# Patient Record
Sex: Female | Born: 1985 | Race: Black or African American | Hispanic: No | Marital: Single | Smoking: Former smoker
Health system: Southern US, Community
[De-identification: ages and names within clinical notes are randomized; demographics above are authoritative.]

## PROBLEM LIST (undated history)

## (undated) HISTORY — PX: TUBAL LIGATION: SHX77

---

## 2005-05-17 ENCOUNTER — Ambulatory Visit: Payer: Self-pay | Admitting: Nurse Practitioner

## 2005-05-21 ENCOUNTER — Ambulatory Visit: Payer: Self-pay | Admitting: *Deleted

## 2005-05-22 ENCOUNTER — Ambulatory Visit: Payer: Self-pay | Admitting: Nurse Practitioner

## 2005-06-19 ENCOUNTER — Ambulatory Visit: Payer: Self-pay | Admitting: Nurse Practitioner

## 2005-06-19 LAB — CONVERTED CEMR LAB: TSH: 1.178 microintl units/mL

## 2005-08-09 ENCOUNTER — Ambulatory Visit: Payer: Self-pay | Admitting: Nurse Practitioner

## 2005-08-19 ENCOUNTER — Ambulatory Visit: Payer: Self-pay | Admitting: Nurse Practitioner

## 2005-12-02 ENCOUNTER — Emergency Department (HOSPITAL_COMMUNITY): Admission: EM | Admit: 2005-12-02 | Discharge: 2005-12-02 | Payer: Self-pay | Admitting: Emergency Medicine

## 2005-12-10 ENCOUNTER — Ambulatory Visit: Payer: Self-pay | Admitting: Family Medicine

## 2005-12-16 ENCOUNTER — Ambulatory Visit (HOSPITAL_COMMUNITY): Admission: RE | Admit: 2005-12-16 | Discharge: 2005-12-16 | Payer: Self-pay | Admitting: Family Medicine

## 2005-12-17 ENCOUNTER — Ambulatory Visit: Payer: Self-pay | Admitting: Nurse Practitioner

## 2006-03-14 ENCOUNTER — Inpatient Hospital Stay (HOSPITAL_COMMUNITY): Admission: AD | Admit: 2006-03-14 | Discharge: 2006-03-14 | Payer: Self-pay | Admitting: Obstetrics & Gynecology

## 2006-03-20 ENCOUNTER — Inpatient Hospital Stay (HOSPITAL_COMMUNITY): Admission: AD | Admit: 2006-03-20 | Discharge: 2006-03-20 | Payer: Self-pay | Admitting: Gynecology

## 2006-05-07 ENCOUNTER — Ambulatory Visit (HOSPITAL_COMMUNITY): Admission: RE | Admit: 2006-05-07 | Discharge: 2006-05-07 | Payer: Self-pay | Admitting: Obstetrics & Gynecology

## 2006-05-22 ENCOUNTER — Encounter (INDEPENDENT_AMBULATORY_CARE_PROVIDER_SITE_OTHER): Payer: Self-pay | Admitting: Nurse Practitioner

## 2006-05-22 LAB — CONVERTED CEMR LAB: Pap Smear: NORMAL

## 2006-05-23 ENCOUNTER — Inpatient Hospital Stay (HOSPITAL_COMMUNITY): Admission: AD | Admit: 2006-05-23 | Discharge: 2006-05-23 | Payer: Self-pay | Admitting: Obstetrics & Gynecology

## 2006-06-25 ENCOUNTER — Ambulatory Visit: Payer: Self-pay | Admitting: Gynecology

## 2006-06-25 ENCOUNTER — Inpatient Hospital Stay (HOSPITAL_COMMUNITY): Admission: AD | Admit: 2006-06-25 | Discharge: 2006-06-25 | Payer: Self-pay | Admitting: Gynecology

## 2006-07-17 ENCOUNTER — Ambulatory Visit: Payer: Self-pay | Admitting: Obstetrics & Gynecology

## 2006-07-17 ENCOUNTER — Inpatient Hospital Stay (HOSPITAL_COMMUNITY): Admission: AD | Admit: 2006-07-17 | Discharge: 2006-07-17 | Payer: Self-pay | Admitting: Obstetrics and Gynecology

## 2006-08-14 ENCOUNTER — Ambulatory Visit: Payer: Self-pay | Admitting: Obstetrics and Gynecology

## 2006-08-14 ENCOUNTER — Inpatient Hospital Stay (HOSPITAL_COMMUNITY): Admission: AD | Admit: 2006-08-14 | Discharge: 2006-08-14 | Payer: Self-pay | Admitting: Obstetrics and Gynecology

## 2006-08-27 ENCOUNTER — Inpatient Hospital Stay (HOSPITAL_COMMUNITY): Admission: AD | Admit: 2006-08-27 | Discharge: 2006-08-27 | Payer: Self-pay | Admitting: Obstetrics & Gynecology

## 2006-09-04 ENCOUNTER — Inpatient Hospital Stay (HOSPITAL_COMMUNITY): Admission: AD | Admit: 2006-09-04 | Discharge: 2006-09-05 | Payer: Self-pay | Admitting: Obstetrics & Gynecology

## 2006-09-04 ENCOUNTER — Ambulatory Visit: Payer: Self-pay | Admitting: *Deleted

## 2006-09-07 ENCOUNTER — Ambulatory Visit: Payer: Self-pay | Admitting: Obstetrics and Gynecology

## 2006-09-07 ENCOUNTER — Observation Stay (HOSPITAL_COMMUNITY): Admission: AD | Admit: 2006-09-07 | Discharge: 2006-09-08 | Payer: Self-pay | Admitting: Gynecology

## 2006-09-09 ENCOUNTER — Ambulatory Visit: Payer: Self-pay | Admitting: *Deleted

## 2006-09-09 ENCOUNTER — Inpatient Hospital Stay (HOSPITAL_COMMUNITY): Admission: AD | Admit: 2006-09-09 | Discharge: 2006-09-09 | Payer: Self-pay | Admitting: Family Medicine

## 2006-09-10 ENCOUNTER — Inpatient Hospital Stay (HOSPITAL_COMMUNITY): Admission: AD | Admit: 2006-09-10 | Discharge: 2006-09-10 | Payer: Self-pay | Admitting: Gynecology

## 2006-09-10 ENCOUNTER — Ambulatory Visit: Payer: Self-pay | Admitting: *Deleted

## 2006-09-11 ENCOUNTER — Ambulatory Visit: Payer: Self-pay | Admitting: Family Medicine

## 2006-09-12 ENCOUNTER — Ambulatory Visit: Payer: Self-pay | Admitting: Family Medicine

## 2006-09-13 ENCOUNTER — Ambulatory Visit: Payer: Self-pay | Admitting: Family Medicine

## 2006-09-13 ENCOUNTER — Inpatient Hospital Stay (HOSPITAL_COMMUNITY): Admission: AD | Admit: 2006-09-13 | Discharge: 2006-09-14 | Payer: Self-pay | Admitting: Family Medicine

## 2006-09-15 ENCOUNTER — Ambulatory Visit: Payer: Self-pay | Admitting: Obstetrics & Gynecology

## 2006-09-18 ENCOUNTER — Ambulatory Visit: Payer: Self-pay | Admitting: *Deleted

## 2006-09-18 ENCOUNTER — Inpatient Hospital Stay (HOSPITAL_COMMUNITY): Admission: AD | Admit: 2006-09-18 | Discharge: 2006-09-21 | Payer: Self-pay | Admitting: Gynecology

## 2006-09-18 ENCOUNTER — Ambulatory Visit: Payer: Self-pay | Admitting: Obstetrics & Gynecology

## 2006-09-22 ENCOUNTER — Encounter: Admission: RE | Admit: 2006-09-22 | Discharge: 2006-10-22 | Payer: Self-pay | Admitting: Gynecology

## 2006-11-10 ENCOUNTER — Emergency Department (HOSPITAL_COMMUNITY): Admission: EM | Admit: 2006-11-10 | Discharge: 2006-11-10 | Payer: Self-pay | Admitting: Emergency Medicine

## 2006-12-05 ENCOUNTER — Emergency Department (HOSPITAL_COMMUNITY): Admission: EM | Admit: 2006-12-05 | Discharge: 2006-12-05 | Payer: Self-pay | Admitting: Emergency Medicine

## 2007-02-09 ENCOUNTER — Encounter: Payer: Self-pay | Admitting: Nurse Practitioner

## 2007-02-09 DIAGNOSIS — J45909 Unspecified asthma, uncomplicated: Secondary | ICD-10-CM | POA: Insufficient documentation

## 2007-02-10 DIAGNOSIS — J309 Allergic rhinitis, unspecified: Secondary | ICD-10-CM | POA: Insufficient documentation

## 2007-07-14 ENCOUNTER — Ambulatory Visit: Payer: Self-pay | Admitting: Internal Medicine

## 2007-07-14 ENCOUNTER — Encounter: Payer: Self-pay | Admitting: Internal Medicine

## 2007-10-01 ENCOUNTER — Ambulatory Visit: Payer: Self-pay | Admitting: Family Medicine

## 2007-10-02 ENCOUNTER — Emergency Department (HOSPITAL_COMMUNITY): Admission: EM | Admit: 2007-10-02 | Discharge: 2007-10-02 | Payer: Self-pay | Admitting: Emergency Medicine

## 2007-11-26 ENCOUNTER — Ambulatory Visit: Payer: Self-pay | Admitting: Internal Medicine

## 2009-07-02 ENCOUNTER — Ambulatory Visit: Payer: Self-pay | Admitting: Advanced Practice Midwife

## 2009-07-02 ENCOUNTER — Inpatient Hospital Stay (HOSPITAL_COMMUNITY): Admission: AD | Admit: 2009-07-02 | Discharge: 2009-07-03 | Payer: Self-pay | Admitting: Obstetrics & Gynecology

## 2009-08-17 ENCOUNTER — Ambulatory Visit: Payer: Self-pay | Admitting: Psychiatry

## 2009-09-11 ENCOUNTER — Ambulatory Visit: Payer: Self-pay | Admitting: Psychiatry

## 2009-09-25 ENCOUNTER — Ambulatory Visit: Payer: Self-pay | Admitting: Psychiatry

## 2010-01-27 ENCOUNTER — Ambulatory Visit: Payer: Self-pay | Admitting: Obstetrics & Gynecology

## 2010-01-27 ENCOUNTER — Inpatient Hospital Stay (HOSPITAL_COMMUNITY)
Admission: AD | Admit: 2010-01-27 | Discharge: 2010-01-27 | Payer: Self-pay | Source: Home / Self Care | Admitting: Obstetrics & Gynecology

## 2010-03-05 ENCOUNTER — Ambulatory Visit: Payer: Self-pay | Admitting: Family

## 2010-03-05 ENCOUNTER — Inpatient Hospital Stay (HOSPITAL_COMMUNITY): Admission: AD | Admit: 2010-03-05 | Discharge: 2010-03-05 | Payer: Self-pay | Admitting: Obstetrics & Gynecology

## 2010-03-10 ENCOUNTER — Ambulatory Visit: Payer: Self-pay | Admitting: Obstetrics and Gynecology

## 2010-03-10 ENCOUNTER — Inpatient Hospital Stay (HOSPITAL_COMMUNITY): Admission: AD | Admit: 2010-03-10 | Discharge: 2010-03-10 | Payer: Self-pay | Admitting: Obstetrics & Gynecology

## 2010-03-12 ENCOUNTER — Ambulatory Visit: Payer: Self-pay | Admitting: Family Medicine

## 2010-03-12 ENCOUNTER — Inpatient Hospital Stay (HOSPITAL_COMMUNITY): Admission: AD | Admit: 2010-03-12 | Discharge: 2010-03-12 | Payer: Self-pay | Admitting: Family Medicine

## 2010-03-19 ENCOUNTER — Inpatient Hospital Stay (HOSPITAL_COMMUNITY): Admission: RE | Admit: 2010-03-19 | Discharge: 2010-03-19 | Payer: Self-pay | Admitting: Obstetrics & Gynecology

## 2010-03-19 ENCOUNTER — Ambulatory Visit: Payer: Self-pay | Admitting: Obstetrics & Gynecology

## 2010-04-04 ENCOUNTER — Ambulatory Visit: Payer: Self-pay | Admitting: Obstetrics & Gynecology

## 2010-04-04 ENCOUNTER — Inpatient Hospital Stay (HOSPITAL_COMMUNITY): Admission: AD | Admit: 2010-04-04 | Discharge: 2010-04-04 | Payer: Self-pay | Admitting: Family Medicine

## 2010-08-29 LAB — URINALYSIS, ROUTINE W REFLEX MICROSCOPIC
Bilirubin Urine: NEGATIVE
Ketones, ur: NEGATIVE mg/dL
Nitrite: NEGATIVE
Protein, ur: NEGATIVE mg/dL
Urobilinogen, UA: 0.2 mg/dL (ref 0.0–1.0)

## 2010-08-29 LAB — WET PREP, GENITAL

## 2010-08-30 LAB — URINALYSIS, ROUTINE W REFLEX MICROSCOPIC
Bilirubin Urine: NEGATIVE
Glucose, UA: NEGATIVE mg/dL
Ketones, ur: NEGATIVE mg/dL
Ketones, ur: NEGATIVE mg/dL
Nitrite: NEGATIVE
Nitrite: NEGATIVE
Nitrite: NEGATIVE
Protein, ur: NEGATIVE mg/dL
Specific Gravity, Urine: 1.01 (ref 1.005–1.030)
Specific Gravity, Urine: <1.005 — ABNORMAL LOW (ref 1.005–1.030)
Urobilinogen, UA: 0.2 mg/dL (ref 0.0–1.0)
Urobilinogen, UA: 0.2 mg/dL (ref 0.0–1.0)
pH: 6 (ref 5.0–8.0)
pH: 6 (ref 5.0–8.0)

## 2010-08-30 LAB — CBC
HCT: 36.2 % (ref 36.0–46.0)
MCH: 28.1 pg (ref 26.0–34.0)
MCHC: 34.2 g/dL (ref 30.0–36.0)
MCV: 82.1 fL (ref 78.0–100.0)
Platelets: 253 10*3/uL (ref 150–400)
RDW: 12 % (ref 11.5–15.5)

## 2010-08-30 LAB — GC/CHLAMYDIA PROBE AMP, GENITAL: GC Probe Amp, Genital: NEGATIVE

## 2010-08-30 LAB — HCG, QUANTITATIVE, PREGNANCY: hCG, Beta Chain, Quant, S: 3447 m[IU]/mL — ABNORMAL HIGH (ref <5)

## 2010-08-30 LAB — WET PREP, GENITAL
Trich, Wet Prep: NONE SEEN
Yeast Wet Prep HPF POC: NONE SEEN

## 2010-08-30 LAB — WOUND CULTURE

## 2010-08-31 LAB — URINALYSIS, ROUTINE W REFLEX MICROSCOPIC
Glucose, UA: NEGATIVE mg/dL
Hgb urine dipstick: NEGATIVE
Specific Gravity, Urine: 1.02 (ref 1.005–1.030)
Urobilinogen, UA: 1 mg/dL (ref 0.0–1.0)

## 2010-08-31 LAB — POCT PREGNANCY, URINE: Preg Test, Ur: NEGATIVE

## 2010-08-31 LAB — GC/CHLAMYDIA PROBE AMP, GENITAL: GC Probe Amp, Genital: NEGATIVE

## 2010-09-02 LAB — URINALYSIS, ROUTINE W REFLEX MICROSCOPIC
Ketones, ur: 15 mg/dL — AB
Nitrite: NEGATIVE
Specific Gravity, Urine: 1.025 (ref 1.005–1.030)
Urobilinogen, UA: 0.2 mg/dL (ref 0.0–1.0)
pH: 6 (ref 5.0–8.0)

## 2010-09-02 LAB — URINE MICROSCOPIC-ADD ON

## 2010-09-02 LAB — GC/CHLAMYDIA PROBE AMP, GENITAL: Chlamydia, DNA Probe: POSITIVE — AB

## 2010-09-02 LAB — WET PREP, GENITAL
Trich, Wet Prep: NONE SEEN
Yeast Wet Prep HPF POC: NONE SEEN

## 2010-09-14 ENCOUNTER — Inpatient Hospital Stay (HOSPITAL_COMMUNITY)
Admission: AD | Admit: 2010-09-14 | Discharge: 2010-09-14 | Disposition: A | Discharge status: Home / Self Care | Payer: BC Managed Care – PPO | Source: Ambulatory Visit | Attending: Obstetrics and Gynecology | Admitting: Obstetrics and Gynecology

## 2010-09-14 DIAGNOSIS — O9989 Other specified diseases and conditions complicating pregnancy, childbirth and the puerperium: Secondary | ICD-10-CM

## 2010-09-14 DIAGNOSIS — M79609 Pain in unspecified limb: Secondary | ICD-10-CM

## 2010-09-14 DIAGNOSIS — O99891 Other specified diseases and conditions complicating pregnancy: Secondary | ICD-10-CM | POA: Insufficient documentation

## 2010-09-14 LAB — CBC
Hemoglobin: 12.9 g/dL (ref 12.0–15.0)
MCHC: 33.6 g/dL (ref 30.0–36.0)
Platelets: 188 10*3/uL (ref 150–400)
RDW: 13.7 % (ref 11.5–15.5)

## 2010-09-15 LAB — URINALYSIS, ROUTINE W REFLEX MICROSCOPIC
Ketones, ur: NEGATIVE mg/dL
Nitrite: NEGATIVE
Protein, ur: NEGATIVE mg/dL
Urobilinogen, UA: 0.2 mg/dL (ref 0.0–1.0)

## 2010-09-15 LAB — URINE MICROSCOPIC-ADD ON

## 2010-09-16 LAB — URINE CULTURE
Colony Count: 25000
Culture  Setup Time: 201203310529

## 2010-11-02 NOTE — Discharge Summary (Signed)
[name], [name]               ACCOUNT NO.:  [id number]   MEDICAL RECORD NO.:  [id number]          PATIENT TYPE:  OBV   LOCATION:  9158                          FACILITY:  [facility]   PHYSICIAN:  Ginger Carne, MD  DATE OF BIRTH:  [birth year]   DATE OF ADMISSION:  09/07/2006  DATE OF DISCHARGE:  09/08/2006                               DISCHARGE SUMMARY   ADMISSION DIAGNOSES:  1. Intrauterine pregnancy at 46 weeks' gestation.  2. Pregnancy induced hypertension.   DISCHARGE DIAGNOSES:  1. Intrauterine pregnancy at 43 weeks' gestation.  2. Pregnancy induced hypertension.   PERTINENT LABORATORY:  Included hemoglobin of 13.1, platelets 247,  creatinine 0.51, AST 19, ALT 12, LDH 152, uric acid 4.9.  A 24-hour  urine protein 72.   STUDIES:  Ultrasound done September 07, 2006, showed baby in cephalic  position.  AFI 11.4.  Estimated fetal weight 2,795 grams which is the  65th percentile.   REASON FOR ADMISSION:  This is a 25 year old gravida 1, at 67 weeks'  gestation who presented with headaches that have been worsening for the  last 2 days and elevated blood pressure.  Blood pressure was 151/97,  139/95, 146/98.  She was admitted for observation, rule out pre-  eclampsia.   HOSPITAL COURSE:  The patient admitted with elevated blood pressure,  started on a 24-hour urine.  Throughout her hospital stay her blood  pressure ranged from 120s to 150s over 80s to 103.  The patient's  headache was relieved with Percocet.  The fetal heart tracing remained  reactive and reassuring.  Her 24-hour urine protein result was 72 mg.  During the hospital course, the patient complained of some uterine  contractions, no leaking of fluid, no vaginal bleeding or discharge.  Her cervical exam was 1, thick and high, and felt to be stable from  previous exam.  She remained hemodynamically stable.   She was discharged home with instructions to follow up at [facility] on September 11, 2006 at 7:45 a.m. for  prenatal visit and NST.  She  is to begin twice weekly testing.  She was also given preterm labor and  pre-eclampsia precautions.   DIET:  Regular.   ACTIVITY:  No restrictions.   DISCHARGE MEDICATIONS:  Tylenol as needed for pain and prenatal vitamins  daily.     ______________________________  Julia Stack, MD      Ginger Carne, MD  Electronically Signed    LNJ/MEDQ  D:  09/08/2006  T:  09/08/2006  Job:  [id number]

## 2010-11-05 ENCOUNTER — Inpatient Hospital Stay (HOSPITAL_COMMUNITY)
Admission: RE | Admit: 2010-11-05 | Discharge: 2010-11-07 | DRG: 652 | Disposition: A | Discharge status: Home / Self Care | Payer: BC Managed Care – PPO | Source: Ambulatory Visit | Attending: Obstetrics and Gynecology | Admitting: Obstetrics and Gynecology

## 2010-11-05 DIAGNOSIS — Z22330 Carrier of Group B streptococcus: Secondary | ICD-10-CM

## 2010-11-05 DIAGNOSIS — Z302 Encounter for sterilization: Secondary | ICD-10-CM

## 2010-11-05 DIAGNOSIS — O1414 Severe pre-eclampsia complicating childbirth: Principal | ICD-10-CM | POA: Diagnosis present

## 2010-11-05 DIAGNOSIS — O99892 Other specified diseases and conditions complicating childbirth: Secondary | ICD-10-CM | POA: Diagnosis present

## 2010-11-05 LAB — CBC
MCHC: 33.8 g/dL (ref 30.0–36.0)
Platelets: 185 10*3/uL (ref 150–400)
RDW: 14 % (ref 11.5–15.5)
WBC: 8.5 10*3/uL (ref 4.0–10.5)

## 2010-11-05 LAB — SURGICAL PCR SCREEN
MRSA, PCR: NEGATIVE
Staphylococcus aureus: NEGATIVE

## 2010-11-06 ENCOUNTER — Other Ambulatory Visit: Payer: Self-pay | Admitting: Obstetrics and Gynecology

## 2010-11-06 LAB — CBC
HCT: 36.2 % (ref 36.0–46.0)
Hemoglobin: 11.9 g/dL — ABNORMAL LOW (ref 12.0–15.0)
MCH: 26.9 pg (ref 26.0–34.0)
MCHC: 32.9 g/dL (ref 30.0–36.0)
RDW: 13.9 % (ref 11.5–15.5)

## 2010-11-12 ENCOUNTER — Inpatient Hospital Stay (HOSPITAL_COMMUNITY)
Admission: AD | Admit: 2010-11-12 | Payer: BC Managed Care – PPO | Source: Ambulatory Visit | Admitting: Obstetrics and Gynecology

## 2010-11-14 NOTE — Op Note (Signed)
NAMEDESHONDA, Julia Lutz               ACCOUNT NO.:  [id number]  MEDICAL RECORD NO.:  [id number]           PATIENT TYPE:  I  LOCATION:  9124                          FACILITY:  [facility]  PHYSICIAN:  Pieter Partridge, MD   DATE OF BIRTH:  12-16-1985  DATE OF PROCEDURE:  11/07/2010 DATE OF DISCHARGE:  11/07/2010                              OPERATIVE REPORT   PREOPERATIVE DIAGNOSIS:  Undesired fertility.  POSTOPERATIVE DIAGNOSIS:  Undesired fertility.  PROCEDURE:  Postpartum tubal ligation via Pomeroy method.  SURGEON:  Pieter Partridge, MD  ASSISTANT:  Technician.  ANESTHESIA:  General.  FINDINGS:  Normal fallopian tubes bilaterally.  SPECIMENS:  Bilateral fallopian tube segments.  DISPOSITION:  Specimen to pathology.  ESTIMATED BLOOD LOSS:  Minimal.  IV FLUIDS:  900 mL.  URINE OUTPUT:  100 mL.  COMPLICATIONS:  None.  DISPOSITION:  To PACU in stable condition.  PROCEDURE IN DETAIL:  Julia Lutz was identified in the holding area. She was then taken to the OR suite and placed in the dorsal supine position and underwent general endotracheal anesthesia without complication.  She was then prepped and draped in a normal sterile fashion.  Foley to gravity was placed.  Marcaine 0.25% approximately 30 mL was injected in the infraumbilical fold.  A 3-mm incision was then made in the infraumbilical fold with the scalpel.  The edges were grasped with Allis clamp and the subcutaneous tissue was dissected with a scalpel.  A Kelly clamp was used to continue to dissect the subcutaneous tissue and the fascia was then grasped with the [employer].  After the fascia was grasped, it was entered sharply with the Mayo scissors, and the fascia was then tagged with a 0 Vicryl suture.  Peritoneum was then identified and tented up with a hemostat and entered sharply with the Metzenbaum scissors.  Intra-abdominal access was confirmed with visualization of omentum.  The patient was then placed in  Trendelenburg and moist sponge was then placed in the abdomen, which was tagged with hemostat.  The left fallopian tube was identified and grasped with the Babcock and then followed out to the fimbriated end.  A tubal ligation was then done via the usual Pomeroy method.  There were two sutures of 2-0 plain gut that were used to ligate.  The fallopian tubes were then transected with the Metzenbaum scissors and the lumen of the fallopian tube was cauterized with the Bovie and other tissues were cauterized as needed.  The tube was then returned to the abdomen.  The same was done on the opposite side.  That tube appeared to be a little longer and again there was another maybe 3 cm segment that was removed without complication.  Also tubes were returned to the abdomen.  All the laparotomy sponge was removed.  The fascia was then reapproximated or grasped with a Kocher clamp and then reapproximated with 0 Vicryl in a continuous locked fashion.  The subcutaneous space was then reapproximated with 4-0 Monocryl in a continuous fashion and then Dermabond was applied to the incision.  The patient tolerated the procedure well and taken to the recovery  room in stable condition.     Pieter Partridge, MD     EBV/MEDQ  D:  11/07/2010  T:  11/08/2010  Job:  [id number]  Electronically Signed by Geryl Rankins MD on 11/14/2010 05:54:17 PM

## 2011-03-12 LAB — BASIC METABOLIC PANEL
BUN: 10
CO2: 27
Calcium: 8.7
Chloride: 99
Creatinine, Ser: 0.67
GFR calc Af Amer: >60

## 2011-03-12 LAB — RAPID STREP SCREEN (MED CTR MEBANE ONLY): Streptococcus, Group A Screen (Direct): NEGATIVE

## 2011-03-12 LAB — CBC
MCHC: 34.8
RBC: 4.56
RDW: 12

## 2011-03-12 LAB — DIFFERENTIAL
Basophils Absolute: 0
Basophils Relative: 0
Lymphocytes Relative: 33
Monocytes Relative: 12
Neutro Abs: 1.5 — ABNORMAL LOW
Neutrophils Relative %: 54

## 2011-04-03 LAB — RAPID STREP SCREEN (MED CTR MEBANE ONLY): Streptococcus, Group A Screen (Direct): NEGATIVE

## 2011-06-25 ENCOUNTER — Other Ambulatory Visit (HOSPITAL_COMMUNITY)
Admission: RE | Admit: 2011-06-25 | Discharge: 2011-06-25 | Disposition: A | Discharge status: Home / Self Care | Payer: BC Managed Care – PPO | Source: Ambulatory Visit | Attending: Obstetrics and Gynecology | Admitting: Obstetrics and Gynecology

## 2011-06-25 ENCOUNTER — Other Ambulatory Visit: Payer: Self-pay | Admitting: Obstetrics and Gynecology

## 2011-06-25 DIAGNOSIS — Z01419 Encounter for gynecological examination (general) (routine) without abnormal findings: Secondary | ICD-10-CM | POA: Insufficient documentation

## 2011-06-25 DIAGNOSIS — Z124 Encounter for screening for malignant neoplasm of cervix: Secondary | ICD-10-CM | POA: Insufficient documentation

## 2011-06-25 DIAGNOSIS — Z1159 Encounter for screening for other viral diseases: Secondary | ICD-10-CM | POA: Insufficient documentation

## 2011-06-25 DIAGNOSIS — Z113 Encounter for screening for infections with a predominantly sexual mode of transmission: Secondary | ICD-10-CM | POA: Insufficient documentation

## 2011-07-29 ENCOUNTER — Other Ambulatory Visit: Payer: Self-pay | Admitting: Obstetrics & Gynecology

## 2011-08-21 ENCOUNTER — Other Ambulatory Visit: Payer: Self-pay | Admitting: Obstetrics & Gynecology

## 2012-07-23 ENCOUNTER — Emergency Department (HOSPITAL_BASED_OUTPATIENT_CLINIC_OR_DEPARTMENT_OTHER): Payer: BC Managed Care – PPO

## 2012-07-23 ENCOUNTER — Emergency Department (HOSPITAL_BASED_OUTPATIENT_CLINIC_OR_DEPARTMENT_OTHER)
Admission: EM | Admit: 2012-07-23 | Discharge: 2012-07-23 | Disposition: A | Discharge status: Home / Self Care | Payer: BC Managed Care – PPO | Attending: Emergency Medicine | Admitting: Emergency Medicine

## 2012-07-23 ENCOUNTER — Encounter (HOSPITAL_BASED_OUTPATIENT_CLINIC_OR_DEPARTMENT_OTHER): Payer: Self-pay | Admitting: *Deleted

## 2012-07-23 DIAGNOSIS — M542 Cervicalgia: Secondary | ICD-10-CM | POA: Insufficient documentation

## 2012-07-23 DIAGNOSIS — R110 Nausea: Secondary | ICD-10-CM | POA: Insufficient documentation

## 2012-07-23 DIAGNOSIS — Z79899 Other long term (current) drug therapy: Secondary | ICD-10-CM | POA: Insufficient documentation

## 2012-07-23 DIAGNOSIS — R51 Headache: Secondary | ICD-10-CM | POA: Insufficient documentation

## 2012-07-23 LAB — COMPREHENSIVE METABOLIC PANEL
ALT: 14 U/L (ref 0–35)
AST: 17 U/L (ref 0–37)
CO2: 27 mEq/L (ref 19–32)
Calcium: 10.1 mg/dL (ref 8.4–10.5)
Chloride: 102 mEq/L (ref 96–112)
GFR calc non Af Amer: >90 mL/min (ref >90)
Potassium: 3.7 mEq/L (ref 3.5–5.1)
Sodium: 140 mEq/L (ref 135–145)
Total Bilirubin: 0.5 mg/dL (ref 0.3–1.2)

## 2012-07-23 LAB — PROTEIN AND GLUCOSE, CSF
Glucose, CSF: 56 mg/dL (ref 43–76)
Total  Protein, CSF: 16 mg/dL (ref 15–45)

## 2012-07-23 LAB — CSF CELL COUNT WITH DIFFERENTIAL
Eosinophils, CSF: NONE SEEN % (ref 0–1)
Segmented Neutrophils-CSF: NONE SEEN % (ref 0–6)
Segmented Neutrophils-CSF: NONE SEEN % (ref 0–6)
WBC, CSF: 1 /mm3 (ref 0–5)

## 2012-07-23 LAB — CBC WITH DIFFERENTIAL/PLATELET
Basophils Absolute: 0 10*3/uL (ref 0.0–0.1)
Lymphocytes Relative: 51 % — ABNORMAL HIGH (ref 12–46)
Neutro Abs: 2.1 10*3/uL (ref 1.7–7.7)
Neutrophils Relative %: 37 % — ABNORMAL LOW (ref 43–77)
Platelets: 271 10*3/uL (ref 150–400)
RDW: 11.9 % (ref 11.5–15.5)
WBC: 5.6 10*3/uL (ref 4.0–10.5)

## 2012-07-23 MED ORDER — IOHEXOL 350 MG/ML SOLN
100.0000 mL | Freq: Once | INTRAVENOUS | Status: AC | PRN
  Administered 2012-07-23: 100 mL via INTRAVENOUS

## 2012-07-23 MED ORDER — IBUPROFEN 800 MG PO TABS
ORAL_TABLET | ORAL | Status: AC
  Filled 2012-07-23: qty 1

## 2012-07-23 MED ORDER — IBUPROFEN 800 MG PO TABS: 800.0000 mg | ORAL_TABLET | Freq: Three times a day (TID) | ORAL | Status: DC

## 2012-07-23 MED ORDER — IBUPROFEN 800 MG PO TABS
800.0000 mg | ORAL_TABLET | Freq: Once | ORAL | Status: AC
  Administered 2012-07-23: 800 mg via ORAL

## 2012-07-23 NOTE — ED Notes (Signed)
Pt amb to triage with quick steady gait in nad. Pt states she was sent here by her pcp to rule out meningitis, due to headaches x 4 days and neck stiffness x 4 days. Denies any fevers, states toradol injections at her pcp office relieve the pain. No pain at this time, states she received a toradol injection at pcp office just pta.

## 2012-07-23 NOTE — ED Provider Notes (Signed)
History     CSN: [id number]  Arrival date & time 07/23/12  1732   First MD Initiated Contact with Patient 07/23/12 1746      Chief Complaint  Patient presents with  . Headache  . Neck Pain  . pt states she was sent to ed for rule out meningitis by her      (Consider location/radiation/quality/duration/timing/severity/associated sxs/prior treatment) HPI Comments: Patient presents from her neurologist office Dr. Brayton El with a four-day history of posterior headaches that radiates down the back of her neck. These come and go, and quickly the base of her skull and radiated up the back of her head. She denies any fevers, chills, vomiting but has had nausea. She's been getting Toradol injections with relief. Headache lasted about 2 hours at a time. She has no headache currently. She was told she may have viral meningitis. She had a CT scan that was reportedly negative. She denies any focal weakness, numbness or tingling. She denies any vision change. She denies any difficulty breathing or swallowing. She denies any runny nose, cough, congestion, shortness of breath or chest pain. 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  The history is provided by the patient.    History reviewed. No pertinent past medical history.  History reviewed. No pertinent past surgical history.  History reviewed. No pertinent family history.  History  Substance Use Topics  . Smoking status: Not on file  . Smokeless tobacco: Not on file  . Alcohol Use: Not on file    OB History    Grav Para Term Preterm Abortions TAB SAB Ect Mult Living                  Review of Systems  Constitutional: Negative for fever, activity change and appetite change.  HENT: Positive for neck pain. Negative for congestion and rhinorrhea.   Respiratory: Negative for cough, chest tightness and shortness of breath.   Cardiovascular: Negative for chest pain.  Gastrointestinal: Positive for nausea. Negative for  vomiting and abdominal pain.  Genitourinary: Negative for dysuria.  Musculoskeletal: Negative for back pain.  Skin: Negative for rash.  Neurological: Positive for headaches. Negative for weakness.  A complete 10 system review of systems was obtained and all systems are negative except as noted in the HPI and PMH.    Allergies  Review of patient's allergies indicates no known allergies.  Home Medications   Current Outpatient Rx  Name  Route  Sig  Dispense  Refill  . CITALOPRAM HYDROBROMIDE 10 MG PO TABS   Oral   Take 10 mg by mouth daily.         . IBUPROFEN 800 MG PO TABS   Oral   Take 1 tablet (800 mg total) by mouth 3 (three) times daily.   21 tablet   0     BP 164/88  Pulse 61  Temp 98.2 F (36.8 C) (Oral)  Resp 18  Ht 5' 0.5" (1.537 m)  Wt 138 lb (62.596 kg)  BMI 26.51 kg/m2  SpO2 100%  LMP 06/30/2012  Physical Exam  Constitutional: She is oriented to person, place, and time. She appears well-developed and well-nourished. No distress.  HENT:  Head: Normocephalic and atraumatic.  Mouth/Throat: Oropharynx is clear and moist. No oropharyngeal exudate.  Eyes: Conjunctivae normal and EOM are normal. Pupils are equal, round, and reactive to light.  Neck: Normal range of motion. Neck supple.       No meningismus  Cardiovascular: Normal  rate, regular rhythm and normal heart sounds.   Pulmonary/Chest: Effort normal and breath sounds normal. No respiratory distress.  Abdominal: Soft. There is no tenderness. There is no rebound and no guarding.  Musculoskeletal: Normal range of motion. She exhibits no edema and no tenderness.  Neurological: She is alert and oriented to person, place, and time. No cranial nerve deficit. She exhibits normal muscle tone. Coordination normal.       CN 2-12 intact, 5/5 strength throughout, no ataxia on finger to nose  Skin: Skin is warm.    ED Course  LUMBAR PUNCTURE Date/Time: 07/23/2012 8:21 PM Performed by: Glynn Octave Authorized by: Glynn Octave Consent: Verbal consent obtained. Written consent obtained. Risks and benefits: risks, benefits and alternatives were discussed Consent given by: patient Patient understanding: patient states understanding of the procedure being performed Patient consent: the patient's understanding of the procedure matches consent given Procedure consent: procedure consent matches procedure scheduled Relevant documents: relevant documents present and verified Test results: test results available and properly labeled Site marked: the operative site was marked Imaging studies: imaging studies available Patient identity confirmed: verbally with patient Time out: Immediately prior to procedure a "time out" was called to verify the correct patient, procedure, equipment, support staff and site/side marked as required. Indications: evaluation for infection Anesthesia: local infiltration Local anesthetic: lidocaine 1% without epinephrine Anesthetic total: 4 ml Preparation: Patient was prepped and draped in the usual sterile fashion. Lumbar space: L4-L5 interspace Patient's position: sitting Needle gauge: 20 Needle type: spinal needle - Quincke tip Needle length: 2.5 in Number of attempts: 1 Fluid appearance: clear Tubes of fluid: 4 Total volume: 4 ml Post-procedure: site cleaned, pressure dressing applied and adhesive bandage applied Patient tolerance: Patient tolerated the procedure well with no immediate complications.   (including critical care time)  Labs Reviewed  CBC WITH DIFFERENTIAL - Abnormal; Notable for the following:    Neutrophils Relative 37 (*)     Lymphocytes Relative 51 (*)     All other components within normal limits  CSF CELL COUNT WITH DIFFERENTIAL - Abnormal; Notable for the following:    RBC Count, CSF 3 (*)     All other components within normal limits  CSF CELL COUNT WITH DIFFERENTIAL - Abnormal; Notable for the following:    RBC  Count, CSF 2 (*)     All other components within normal limits  COMPREHENSIVE METABOLIC PANEL  PROTEIN AND GLUCOSE, CSF  CSF CULTURE   Ct Angio Head W/cm &/or Wo Cm  07/23/2012  *RADIOLOGY REPORT*  Clinical Data:  Headache and neck stiffness for 4 days. Meningitis.  CT ANGIOGRAPHY HEAD AND NECK  Technique:  Multidetector CT imaging of the head and neck was performed using the standard protocol during bolus administration of intravenous contrast.  Multiplanar CT image reconstructions including MIPs were obtained to evaluate the vascular anatomy. Carotid stenosis measurements (when applicable) are obtained utilizing NASCET criteria, using the distal internal carotid diameter as the denominator.  Contrast: OMNIPAQUE IOHEXOL 350 MG/ML SOLN  Comparison:   None.  CTA NECK  Findings:  Common origin of the innominate and left common carotid artery.  No evidence of carotid dissection or findings of fibromuscular dysplasia.  Left vertebral artery arises directly from the aortic arch. Right vertebral artery minimally dominant in size.  No evidence of vertebral artery dissection or fibromuscular dysplasia.  Visualized lungs are clear.  Mild prominence palatine tonsils without evidence of abscess.  Scattered increased number of normal size lymph nodes.  Largest lymph nodes are in the level II region with index lymph node left level II region measuring 1.3 x 0.9 x 1.1 cm.  No bony destructive lesion.  Minimal cervical bulges.   Review of the MIP images confirms the above findings.  IMPRESSION: No evidence of carotid artery or vertebral artery dissection. Please see above.  CTA HEAD  Findings:  No intracranial hemorrhage.  No intracranial enhancing lesion.  No hydrocephalus.  No CT evidence of large acute infarct.  Visualized paranasal sinuses, mastoid air cells and middle ear cavities are clear.  Orbital structures unremarkable.  If meningitis is of high clinical concern this would require further investigation.  No  evidence of major dural sinus thrombosis.  Preferential drainage on the right.  No evidence of intracranial aneurysm or vascular malformation.   Review of the MIP images confirms the above findings.  IMPRESSION: No intracranial hemorrhage.  No intracranial enhancing lesion.  Visualized paranasal sinuses, mastoid air cells and middle ear cavities are clear.  Orbital structures unremarkable.  If meningitis is of high clinical concern this would require further investigation.  No evidence of major dural sinus thrombosis.  Preferential drainage on the right.  No evidence of intracranial aneurysm or vascular malformation.   Original Report Authenticated By: Lacy Duverney, M.D.    Ct Angio Neck W/cm &/or Wo/cm  07/23/2012  *RADIOLOGY REPORT*  Clinical Data:  Headache and neck stiffness for 4 days. Meningitis.  CT ANGIOGRAPHY HEAD AND NECK  Technique:  Multidetector CT imaging of the head and neck was performed using the standard protocol during bolus administration of intravenous contrast.  Multiplanar CT image reconstructions including MIPs were obtained to evaluate the vascular anatomy. Carotid stenosis measurements (when applicable) are obtained utilizing NASCET criteria, using the distal internal carotid diameter as the denominator.  Contrast: OMNIPAQUE IOHEXOL 350 MG/ML SOLN  Comparison:   None.  CTA NECK  Findings:  Common origin of the innominate and left common carotid artery.  No evidence of carotid dissection or findings of fibromuscular dysplasia.  Left vertebral artery arises directly from the aortic arch. Right vertebral artery minimally dominant in size.  No evidence of vertebral artery dissection or fibromuscular dysplasia.  Visualized lungs are clear.  Mild prominence palatine tonsils without evidence of abscess.  Scattered increased number of normal size lymph nodes.  Largest lymph nodes are in the level II region with index lymph node left level II region measuring 1.3 x 0.9 x 1.1 cm.  No bony  destructive lesion.  Minimal cervical bulges.   Review of the MIP images confirms the above findings.  IMPRESSION: No evidence of carotid artery or vertebral artery dissection. Please see above.  CTA HEAD  Findings:  No intracranial hemorrhage.  No intracranial enhancing lesion.  No hydrocephalus.  No CT evidence of large acute infarct.  Visualized paranasal sinuses, mastoid air cells and middle ear cavities are clear.  Orbital structures unremarkable.  If meningitis is of high clinical concern this would require further investigation.  No evidence of major dural sinus thrombosis.  Preferential drainage on the right.  No evidence of intracranial aneurysm or vascular malformation.   Review of the MIP images confirms the above findings.  IMPRESSION: No intracranial hemorrhage.  No intracranial enhancing lesion.  Visualized paranasal sinuses, mastoid air cells and middle ear cavities are clear.  Orbital structures unremarkable.  If meningitis is of high clinical concern this would require further investigation.  No evidence of major dural sinus thrombosis.  Preferential drainage on the  right.  No evidence of intracranial aneurysm or vascular malformation.   Original Report Authenticated By: Lacy Duverney, M.D.      1. Headache       MDM  4 days of intermittent posterior headaches that are in the back of her neck and go. Denies thunderclap onset denies fever, chills, nausea or vomiting. Some photophobia. Given Toradol at neurologist office and sent for meningitis evaluation.  Patient has no meningismus or focal neurological deficits. My suspicion for meningitis is low. There is no evidence of intracranial pathology no evidence of carotid or vertebral artery dissection.  Patient elect to proceed with LP for diagnosis. Completed as above.  LP negative for infection or bleeding. Patient reassured. He'll follow up with her doctor.        Glynn Octave, MD 07/23/12 [phone]

## 2012-07-26 LAB — CSF CULTURE W GRAM STAIN: Gram Stain: NONE SEEN

## 2013-03-01 IMAGING — CT CT ANGIO HEAD
2 of 9 series · 7 of 33 positions shown · IV contrast (APPLIED)
Comparison: None.

CTA NECK

CLINICAL DATA: Headache and neck stiffness for 4 days.
Meningitis.

CT ANGIOGRAPHY HEAD AND NECK
TECHNIQUE: Multidetector CT imaging of the head and neck was
performed using the standard protocol during bolus administration
of intravenous contrast.  Multiplanar CT image reconstructions
including MIPs were obtained to evaluate the vascular anatomy.
Carotid stenosis measurements (when applicable) are obtained
utilizing NASCET criteria, using the distal internal carotid
diameter as the denominator.
Contrast: 100mL OMNIPAQUE IOHEXOL 350 MG/ML SOLN

[Series 7: angio 2.0 b26f · axial · 0.52mm/px · z∈[-133,-21]mm · 2 of 170 slices shown]
[im 57/170  soft-tissue]
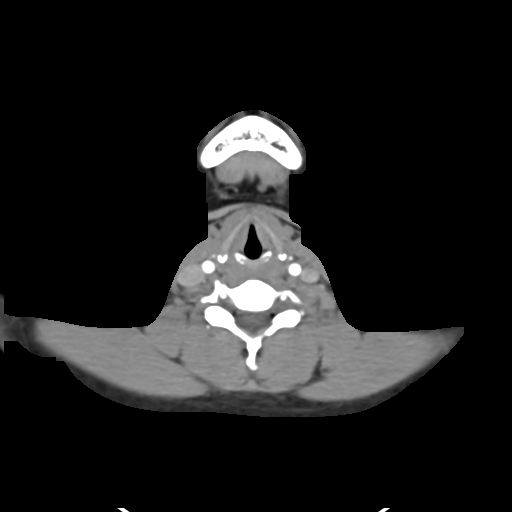
[im 113/170  soft-tissue]
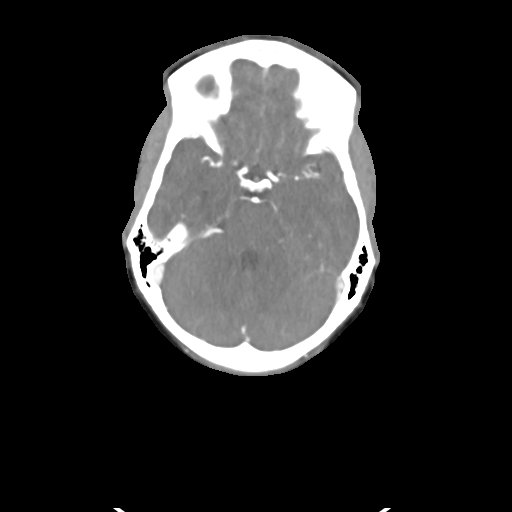

[Series 13: ax thin · axial · 0.65mm/px · z∈[-189,+35]mm · 5 of 337 slices shown]
[im 57/337  soft-tissue]
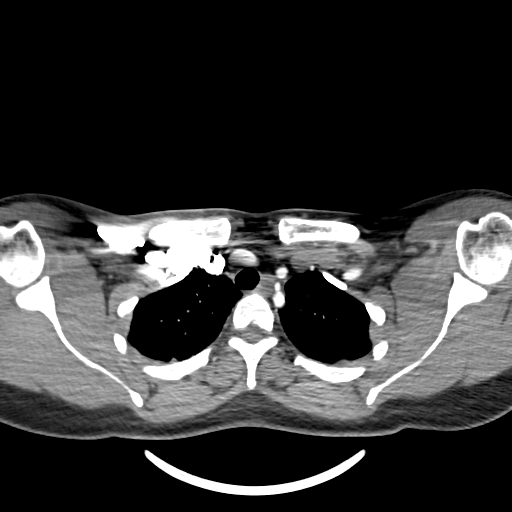
[im 113/337  bone]
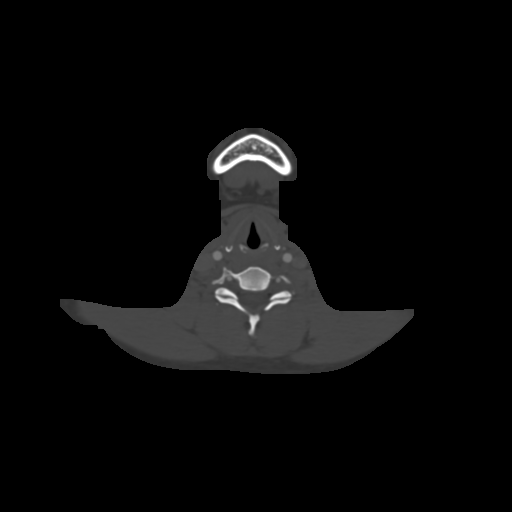
[im 169/337  soft-tissue]
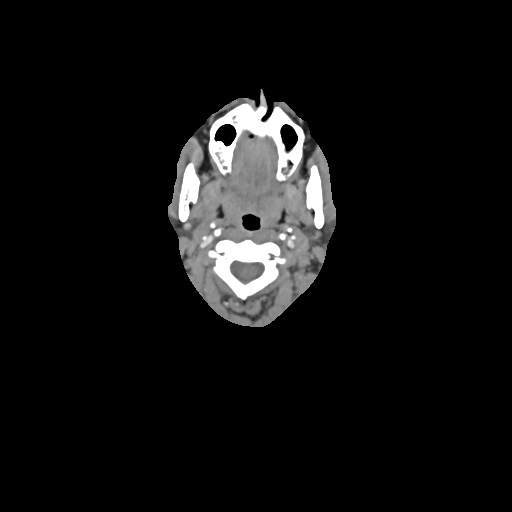
[im 225/337  bone]
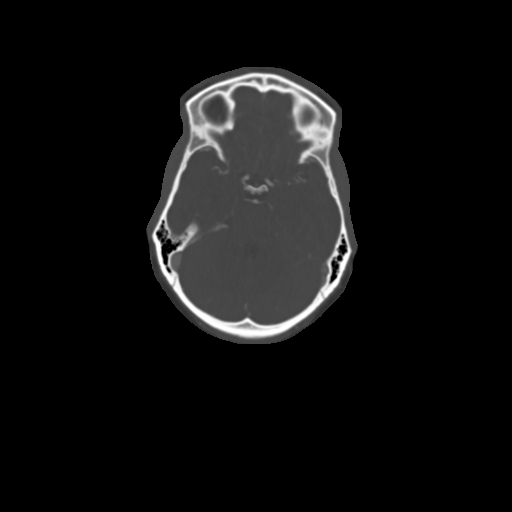
[im 281/337  soft-tissue]
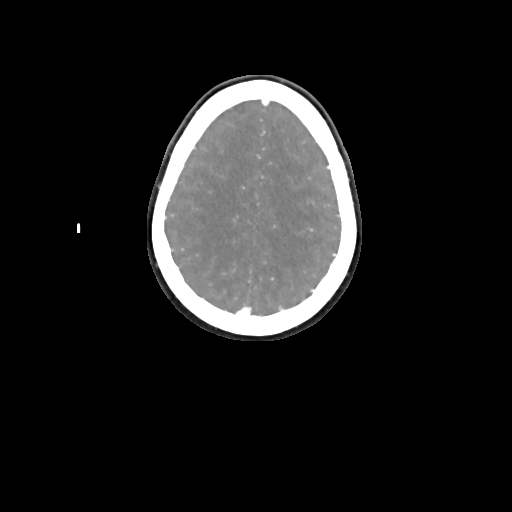

[7 of 33 positions shown; findings below may reference images not displayed]

FINDINGS: Common origin of the innominate and left common carotid
artery.

No evidence of carotid dissection or findings of fibromuscular
dysplasia.

Left vertebral artery arises directly from the aortic arch. Right
vertebral artery minimally dominant in size.  No evidence of
vertebral artery dissection or fibromuscular dysplasia.

Visualized lungs are clear.

Mild prominence palatine tonsils without evidence of abscess.

Scattered increased number of normal size lymph nodes.  Largest
lymph nodes are in the level II region with index lymph node left
level II region measuring 1.3 x 0.9 x 1.1 cm.

No bony destructive lesion.  Minimal cervical bulges.

 Review of the MIP images confirms the above findings.
IMPRESSION: No evidence of carotid artery or vertebral artery dissection.
Please see above.

CTA HEAD
FINDINGS: No intracranial hemorrhage.

No intracranial enhancing lesion.

No hydrocephalus.

No CT evidence of large acute infarct.

Visualized paranasal sinuses, mastoid air cells and middle ear
cavities are clear.  Orbital structures unremarkable.

If meningitis is of high clinical concern this would require
further investigation.

No evidence of major dural sinus thrombosis.  Preferential drainage
on the right.

No evidence of intracranial aneurysm or vascular malformation.

 Review of the MIP images confirms the above findings.
IMPRESSION: No intracranial hemorrhage.

No intracranial enhancing lesion.

Visualized paranasal sinuses, mastoid air cells and middle ear
cavities are clear.  Orbital structures unremarkable.

If meningitis is of high clinical concern this would require
further investigation.

No evidence of major dural sinus thrombosis.  Preferential drainage
on the right.

No evidence of intracranial aneurysm or vascular malformation.

## 2015-05-19 ENCOUNTER — Encounter (HOSPITAL_BASED_OUTPATIENT_CLINIC_OR_DEPARTMENT_OTHER): Payer: Self-pay | Admitting: *Deleted

## 2015-05-19 ENCOUNTER — Emergency Department (HOSPITAL_BASED_OUTPATIENT_CLINIC_OR_DEPARTMENT_OTHER)
Admission: EM | Admit: 2015-05-19 | Discharge: 2015-05-19 | Disposition: A | Discharge status: Home / Self Care | Payer: BLUE CROSS/BLUE SHIELD | Attending: Emergency Medicine | Admitting: Emergency Medicine

## 2015-05-19 DIAGNOSIS — R1013 Epigastric pain: Secondary | ICD-10-CM | POA: Insufficient documentation

## 2015-05-19 DIAGNOSIS — M25512 Pain in left shoulder: Secondary | ICD-10-CM | POA: Diagnosis present

## 2015-05-19 DIAGNOSIS — F17200 Nicotine dependence, unspecified, uncomplicated: Secondary | ICD-10-CM | POA: Diagnosis not present

## 2015-05-19 DIAGNOSIS — M7522 Bicipital tendinitis, left shoulder: Secondary | ICD-10-CM | POA: Insufficient documentation

## 2015-05-19 DIAGNOSIS — R0981 Nasal congestion: Secondary | ICD-10-CM | POA: Insufficient documentation

## 2015-05-19 DIAGNOSIS — R079 Chest pain, unspecified: Secondary | ICD-10-CM | POA: Insufficient documentation

## 2015-05-19 DIAGNOSIS — M67922 Unspecified disorder of synovium and tendon, left upper arm: Secondary | ICD-10-CM

## 2015-05-19 MED ORDER — IBUPROFEN 800 MG PO TABS
800.0000 mg | ORAL_TABLET | Freq: Once | ORAL | Status: DC
  Filled 2015-05-19: qty 1

## 2015-05-19 MED ORDER — GI COCKTAIL ~~LOC~~
30.0000 mL | Freq: Once | ORAL | Status: DC
  Filled 2015-05-19: qty 30

## 2015-05-19 NOTE — ED Provider Notes (Signed)
CSN: [id number]     Arrival date & time 05/19/15  1944 History  By signing my name below, I, Gwenyth Ober, attest that this documentation has been prepared under the direction and in the presence of Melene Plan, DO.  Electronically Signed: Gwenyth Ober, ED Scribe. 05/19/2015. 8:08 PM.   Chief Complaint  Patient presents with  . Shoulder Pain   The history is provided by the patient. No language interpreter was used.    HPI Comments: Julia Lutz is a 29 y.o. female who presents to the Emergency Department complaining of intermittent, moderate left shoulder pain that started 2 days ago. She states chest discomfort, that started after the shoulder pain, and congestion as associated symptoms. Pt notes hearing a cracking sound with lifting her arm. She has not tried any treatment PTA. Pt is concerned about possible cardiac injury because of increased BP. Her family history of CAD is unknown. She denies recent injuries or falls affecting her shoulder. Pt also denies diaphoresis, SOB, leg swelling and nausea.  Pt reports intermittent, sharp, non-radiating periumbilical pain that occurs daily and started 1 week ago. Her pain becomes worse with eating. She denies constipation or difficulty with BM.  History reviewed. No pertinent past medical history. History reviewed. No pertinent past surgical history. No family history on file. Social History  Substance Use Topics  . Smoking status: Current Some Day Smoker  . Smokeless tobacco: None  . Alcohol Use: Yes     Comment: occ   OB History    No data available     Review of Systems  Constitutional: Negative for fever, chills and diaphoresis.  HENT: Positive for congestion. Negative for rhinorrhea.   Eyes: Negative for redness and visual disturbance.  Respiratory: Negative for shortness of breath and wheezing.   Cardiovascular: Positive for chest pain. Negative for palpitations and leg swelling.  Gastrointestinal: Positive for abdominal  pain. Negative for nausea, vomiting and constipation.  Genitourinary: Negative for dysuria and urgency.  Musculoskeletal: Positive for arthralgias. Negative for myalgias.  Skin: Negative for pallor and wound.  Neurological: Negative for dizziness and headaches.   Allergies  Review of patient's allergies indicates no known allergies.  Home Medications   Prior to Admission medications   Medication Sig Start Date End Date Taking? Authorizing Provider  citalopram (CELEXA) 10 MG tablet Take 10 mg by mouth daily.    Historical Provider, MD  ibuprofen (ADVIL,MOTRIN) 800 MG tablet Take 1 tablet (800 mg total) by mouth 3 (three) times daily. 07/23/12   Glynn Octave, MD   BP 146/95 mmHg  Pulse 96  Temp(Src) 97.9 F (36.6 C) (Oral)  Resp 19  Ht  (1.549 m)  Wt 150 lb (68.04 kg)  BMI 28.36 kg/m2  SpO2 100%  LMP 05/06/2015 Physical Exam  Constitutional: She is oriented to person, place, and time. She appears well-developed and well-nourished. No distress.  HENT:  Head: Normocephalic and atraumatic.  Eyes: EOM are normal. Pupils are equal, round, and reactive to light.  Neck: Normal range of motion. Neck supple.  Cardiovascular: Normal rate, regular rhythm and normal heart sounds.  Exam reveals no gallop and no friction rub.   No murmur heard. Pulmonary/Chest: Effort normal and breath sounds normal. No respiratory distress. She has no wheezes. She has no rales.  Abdominal: Soft. She exhibits no distension. There is tenderness (Mild epigastric). There is negative Murphy's sign.  Musculoskeletal: She exhibits tenderness. She exhibits no edema.  TTP to left biceps groove Pain with forward flexion  of left shoulder and supination No tenderness to [facility] joint FROM of left shoulder  Neurological: She is alert and oriented to person, place, and time.  Skin: Skin is warm and dry. She is not diaphoretic.  Psychiatric: She has a normal mood and affect. Her behavior is normal.  Nursing note and  vitals reviewed.   ED Course  Procedures  DIAGNOSTIC STUDIES: Oxygen Saturation is 100% on RA, normal by my interpretation.    COORDINATION OF CARE: 8:10 PM Discussed EKG results, suspicion for bicep tendonitis and treatment plan with pt which includes lab work, GI cocktail and OTC Ibuprofen/Naprosyn for shoulder pain. Advised pt to follow-up with her PCP regarding elevated BP. Pt agreed to plan.  Labs Review Labs Reviewed  CBC WITH DIFFERENTIAL/PLATELET  COMPREHENSIVE METABOLIC PANEL  LIPASE, BLOOD  PREGNANCY, URINE    Imaging Review No results found. I have personally reviewed and evaluated these lab results as part of my medical decision-making.   EKG Interpretation   Date/Time:  Friday May 19 2015 20:02:49 EST Ventricular Rate:  104 PR Interval:  142 QRS Duration: 80 QT Interval:  336 QTC Calculation: 441 R Axis:   61 Text Interpretation:  Sinus tachycardia Otherwise normal ECG No old  tracing to compare Confirmed by Anessa Charley MD, DANIEL [phone]) on 05/19/2015  8:43:36 PM      MDM   Final diagnoses:  Epigastric abdominal pain  Tendinopathy of left biceps tendon    29 yo F with a chief complaint of epigastric abdominal pain and left shoulder pain. Abdominal pains been going on for about a week. Related to food. Left shoulder pain is worse with movement palpation. Exam of the left shoulder consistent with biceps tendinitis. Discussed outpatient management of this. No noted acute injury see no need for x-ray at this time. Patient with benign abdomen. Mild epigastric tenderness. History consistent with likely reflux disease. Discussed with patient utility of obtaining laboratory evaluation to rule out pancreatitis or possible biliary or liver pathology. Patient currently declining further workup. Declining any medications. She was concerned that this was a acute myocardial infarction. Discussed that at her age with a normal EKG and no family history of MI this is  extremely unlikely.  Feels better after talking about how this unlikely. She will follow with her family doctor in one week. Start Zantac.  Patient was hypertensive while in the emergency department. They will follow-up with her PCP in 1 week for recheck.   8:49 PM:  I have discussed the diagnosis/risks/treatment options with the patient and family and believe the pt to be eligible for discharge home to follow-up with PCP. We also discussed returning to the ED immediately if new or worsening sx occur. We discussed the sx which are most concerning (e.g., sudden worsening pain, fever, inability to tolerate by mouth) that necessitate immediate return. Medications administered to the patient during their visit and any new prescriptions provided to the patient are listed below.  Medications given during this visit Medications  gi cocktail (Maalox,Lidocaine,Donnatal) (30 mLs Oral Not Given 05/19/15 2041)  ibuprofen (ADVIL,MOTRIN) tablet 800 mg (800 mg Oral Not Given 05/19/15 2041)    New Prescriptions   No medications on file    The patient appears reasonably screen and/or stabilized for discharge and I doubt any other medical condition or other [facility] requiring further screening, evaluation, or treatment in the ED at this time prior to discharge.     I personally performed the services described in this documentation, which was scribed  in my presence. The recorded information has been reviewed and is accurate.    Melene Planan Brecken Dewoody, DO 05/19/15 2049

## 2015-05-19 NOTE — Discharge Instructions (Signed)
Takes Zantac 150 mg twice a day. Follow-up with her doctor for recheck. Return for fever inability to eat or drink Abdominal Pain, Adult Many things can cause abdominal pain. Usually, abdominal pain is not caused by a disease and will improve without treatment. It can often be observed and treated at home. Your health care provider will do a physical exam and possibly order blood tests and X-rays to help determine the seriousness of your pain. However, in many cases, more time must pass before a clear cause of the pain can be found. Before that point, your health care provider may not know if you need more testing or further treatment. HOME CARE INSTRUCTIONS Monitor your abdominal pain for any changes. The following actions may help to alleviate any discomfort you are experiencing:  Only take over-the-counter or prescription medicines as directed by your health care provider.  Do not take laxatives unless directed to do so by your health care provider.  Try a clear liquid diet (broth, tea, or water) as directed by your health care provider. Slowly move to a bland diet as tolerated. SEEK MEDICAL CARE IF:  You have unexplained abdominal pain.  You have abdominal pain associated with nausea or diarrhea.  You have pain when you urinate or have a bowel movement.  You experience abdominal pain that wakes you in the night.  You have abdominal pain that is worsened or improved by eating food.  You have abdominal pain that is worsened with eating fatty foods.  You have a fever. SEEK IMMEDIATE MEDICAL CARE IF:  Your pain does not go away within 2 hours.  You keep throwing up (vomiting).  Your pain is felt only in portions of the abdomen, such as the right side or the left lower portion of the abdomen.  You pass bloody or black tarry stools. MAKE SURE YOU:  Understand these instructions.  Will watch your condition.  Will get help right away if you are not doing well or get worse.     This information is not intended to replace advice given to you by your health care provider. Make sure you discuss any questions you have with your health care provider.   Document Released: 03/13/2005 Document Revised: 02/22/2015 Document Reviewed: 02/10/2013 Elsevier Interactive Patient Education [employer]2016 [employer]vier Inc.

## 2015-05-19 NOTE — ED Notes (Signed)
Left shoulder pain x 2 days. Worse when she lays down. No injury.

## 2015-05-19 NOTE — ED Notes (Addendum)
RN called to room by patient's family. Patient informed nurse she does not want to have labs checked or take meds prescribed by EDP. RN encouraged patient to get labs and medication. She stated "now that I know my EKG is fine, and my blood pressure came down,  I think I'd rather follow up with my doctor. EDP notified of patient's decision.

## 2015-11-17 ENCOUNTER — Emergency Department (HOSPITAL_BASED_OUTPATIENT_CLINIC_OR_DEPARTMENT_OTHER)
Admission: EM | Admit: 2015-11-17 | Discharge: 2015-11-18 | Disposition: A | Discharge status: Home / Self Care | Payer: BLUE CROSS/BLUE SHIELD | Attending: Emergency Medicine | Admitting: Emergency Medicine

## 2015-11-17 ENCOUNTER — Encounter (HOSPITAL_BASED_OUTPATIENT_CLINIC_OR_DEPARTMENT_OTHER): Payer: Self-pay

## 2015-11-17 DIAGNOSIS — F17200 Nicotine dependence, unspecified, uncomplicated: Secondary | ICD-10-CM | POA: Insufficient documentation

## 2015-11-17 DIAGNOSIS — M542 Cervicalgia: Secondary | ICD-10-CM | POA: Diagnosis present

## 2015-11-17 DIAGNOSIS — R591 Generalized enlarged lymph nodes: Secondary | ICD-10-CM | POA: Diagnosis not present

## 2015-11-17 NOTE — ED Notes (Signed)
Pt has had some under the chin, neck swelling off and on for the last week with mild pain to palpation.  Two masses are felt in triage, one on the right and one in the middle.  No fever, has had two dental surgeries within the last few months.  No difficulty breathing, some painful swallowing.

## 2015-11-17 NOTE — ED Provider Notes (Signed)
CSN: [id number]     Arrival date & time 11/17/15  2147 History   First MD Initiated Contact with Patient 11/17/15 2250     Chief Complaint  Patient presents with  . Neck Pain   HPI  Ms. Julia Lutz is a 30 year old female presenting with neck lymphadenopathy. Pt reports a tender right sided lymph node appears approximately 1 week ago. The lymph node changes in size and is currently what she describes as small. She notes another lymph node appeared underneath her chin a few days ago. This also waxes and wanes in size. She denies known exacerbating factors. She notes that eating does not cause the nodes to grow or become painful. She states that her neck is not painful; she describes it as more of a discomfort. She denies difficulty swallowing or breathing. The pain does not radiate. She notes that she had a root canal recently and had finished a course of clindamycin two months ago. She denies current dental pain. Denies fevers, chills, sore throat, sensation of throat closing, chest pain or SOB.   History reviewed. No pertinent past medical history. History reviewed. No pertinent past surgical history. No family history on file. Social History  Substance Use Topics  . Smoking status: Current Some Day Smoker  . Smokeless tobacco: None  . Alcohol Use: Yes     Comment: occ   OB History    No data available     Review of Systems  All other systems reviewed and are negative.     Allergies  Review of patient's allergies indicates no known allergies.  Home Medications   Prior to Admission medications   Medication Sig Start Date End Date Taking? Authorizing Provider  amoxicillin-clavulanate (AUGMENTIN) 875-125 MG tablet Take 1 tablet by mouth every 12 (twelve) hours. 11/18/15   Gailya Tauer, PA-C  citalopram (CELEXA) 10 MG tablet Take 10 mg by mouth daily.    Historical Provider, MD  fluconazole (DIFLUCAN) 150 MG tablet Take 1 tablet (150 mg total) by mouth daily. 11/18/15 11/25/15  Mariadelcarmen Corella, PA-C  ibuprofen (ADVIL,MOTRIN) 800 MG tablet Take 1 tablet (800 mg total) by mouth 3 (three) times daily. 07/23/12   Glynn OctaveStephen Rancour, MD   BP 125/76 mmHg  Pulse 74  Temp(Src) 99 F (37.2 C) (Oral)  Resp 20  Ht 5\' 1"  (1.549 m)  Wt 68.04 kg  BMI 28.36 kg/m2  SpO2 99%  LMP 11/10/2015 Physical Exam  Constitutional: She appears well-developed and well-nourished. No distress.  Nontoxic appearing.   HENT:  Head: Normocephalic and atraumatic.  Right Ear: External ear normal.  Left Ear: External ear normal.  Mouth/Throat: Oropharynx is clear and moist.  No oropharyngeal erythema, edema or exudate. Tolerating secretions.   Eyes: Conjunctivae are normal. Right eye exhibits no discharge. Left eye exhibits no discharge. No scleral icterus.  Neck: Normal range of motion.  No soft tissue swelling of the neck. FROM of neck intact without pain  Cardiovascular: Normal rate.   Pulmonary/Chest: Effort normal.  Musculoskeletal: Normal range of motion.  Moves all extremities spontaneously  Lymphadenopathy:       Head (right side): Submental and submandibular adenopathy present.  One mildly tender, soft, mobile right submandibular lymph node. One non,tender, mobile submental lymph node. No overlying erythema, soft tissue swelling, induration or fluctuance. No other lymphadenopathy.   Neurological: She is alert. Coordination normal.  Skin: Skin is warm and dry.  Psychiatric: She has a normal mood and affect. Her behavior is normal.  Nursing  note and vitals reviewed.   ED Course  Procedures (including critical care time) Labs Review Labs Reviewed - No data to display  Imaging Review No results found. I have personally reviewed and evaluated these images and lab results as part of my medical decision-making.   EKG Interpretation None      MDM   Final diagnoses:  Lymphadenopathy   30 year old female presenting with lymphadenopathy x 1 week. Afebrile. Initial BP was elevated at  169/110 which was 125/76 on repeat measurement. Soft, mobile submandibular and submental lymph nodes present. No overlying erythema, induration, fluctuance or warmth. Oropharynx without edema. Tolerating secretions. FROM of neck intact. No airway compromise. Given recent dental surgery and tender lymphadenopathy, will discharge with augmentin to cover possible oropharyngeal infection causing lymphadenitis. Exam not concerning for Ludwig's angina. Pt is to follow up with her PCP in 1 week for recheck of her lymph nodes. Return precautions given in discharge paperwork and discussed with pt at bedside. Pt stable for discharge     Alveta Heimlich, PA-C 11/18/15 0047  Tomasita Crumble, MD 11/18/15 [phone]

## 2015-11-18 MED ORDER — FLUCONAZOLE 150 MG PO TABS: 160.0000 mg | ORAL_TABLET | Freq: Every day | ORAL | Status: AC

## 2015-11-18 MED ORDER — AMOXICILLIN-POT CLAVULANATE 875-125 MG PO TABS: 1.0000 | ORAL_TABLET | Freq: Two times a day (BID) | ORAL | Status: DC

## 2015-11-18 NOTE — Discharge Instructions (Signed)

## 2015-11-18 NOTE — ED Notes (Signed)
Pt verbalizes understanding of d/c instructions and denies any further needs at this time. 

## 2016-05-10 ENCOUNTER — Encounter (HOSPITAL_COMMUNITY): Payer: Self-pay | Admitting: *Deleted

## 2016-05-10 ENCOUNTER — Inpatient Hospital Stay (HOSPITAL_COMMUNITY)
Admission: AD | Admit: 2016-05-10 | Discharge: 2016-05-10 | Disposition: A | Discharge status: Home / Self Care | Payer: BLUE CROSS/BLUE SHIELD | Source: Ambulatory Visit | Attending: Obstetrics & Gynecology | Admitting: Obstetrics & Gynecology

## 2016-05-10 DIAGNOSIS — Z9851 Tubal ligation status: Secondary | ICD-10-CM | POA: Diagnosis not present

## 2016-05-10 DIAGNOSIS — N898 Other specified noninflammatory disorders of vagina: Secondary | ICD-10-CM | POA: Insufficient documentation

## 2016-05-10 DIAGNOSIS — Z8619 Personal history of other infectious and parasitic diseases: Secondary | ICD-10-CM | POA: Diagnosis not present

## 2016-05-10 DIAGNOSIS — Z3202 Encounter for pregnancy test, result negative: Secondary | ICD-10-CM | POA: Insufficient documentation

## 2016-05-10 DIAGNOSIS — Z87891 Personal history of nicotine dependence: Secondary | ICD-10-CM | POA: Diagnosis not present

## 2016-05-10 DIAGNOSIS — N760 Acute vaginitis: Secondary | ICD-10-CM | POA: Diagnosis not present

## 2016-05-10 DIAGNOSIS — R109 Unspecified abdominal pain: Secondary | ICD-10-CM | POA: Diagnosis present

## 2016-05-10 DIAGNOSIS — B9689 Other specified bacterial agents as the cause of diseases classified elsewhere: Secondary | ICD-10-CM | POA: Diagnosis not present

## 2016-05-10 LAB — URINALYSIS, ROUTINE W REFLEX MICROSCOPIC
Bilirubin Urine: NEGATIVE
GLUCOSE, UA: NEGATIVE mg/dL
Hgb urine dipstick: NEGATIVE
Ketones, ur: NEGATIVE mg/dL
LEUKOCYTES UA: NEGATIVE
Nitrite: NEGATIVE
PROTEIN: NEGATIVE mg/dL
Specific Gravity, Urine: 1.025 (ref 1.005–1.030)
pH: 6 (ref 5.0–8.0)

## 2016-05-10 LAB — POCT PREGNANCY, URINE: PREG TEST UR: NEGATIVE

## 2016-05-10 LAB — WET PREP, GENITAL
Sperm: NONE SEEN
TRICH WET PREP: NONE SEEN
WBC, Wet Prep HPF POC: NONE SEEN
Yeast Wet Prep HPF POC: NONE SEEN

## 2016-05-10 MED ORDER — METRONIDAZOLE 500 MG PO TABS: 500.0000 mg | ORAL_TABLET | Freq: Three times a day (TID) | ORAL | 0 refills | Status: AC

## 2016-05-10 NOTE — Discharge Instructions (Signed)

## 2016-05-10 NOTE — MAU Note (Signed)
Pt states she had sex Saturday while she was out of town, found a condom on Monday inside of her, the condom "dropped out" in the toilet.  Ever since has been having abd pain, cramping, discharge & odor.  Started vomiting last night.

## 2016-05-10 NOTE — MAU Provider Note (Signed)
History   Julia Lutz is a 30 year old G2P1101 here with complaints of abdominal pain and vaginal discharge since Monday. She had sexual intercourse on 11/18 and then on 11/20 the used condom fell out of her vagina. At that point she began to notice vaginal discharge and abdominal pain in the suprapubic region.   CSN: [id number]  Arrival date and time: 05/10/16 1200   None     Chief Complaint  Patient presents with  . Abdominal Pain  . Vaginal Discharge   Abdominal Pain  This is a new problem. The current episode started in the past 7 days. The onset quality is gradual. The problem occurs constantly. The problem has been gradually worsening. The pain is located in the suprapubic region. The pain is at a severity of 6/10. The pain is moderate. The quality of the pain is cramping and aching. The abdominal pain radiates to the suprapubic region. Associated symptoms include vomiting. Pertinent negatives include no anorexia, arthralgias, belching, constipation, diarrhea, dysuria, fever, flatus, frequency, headaches, hematochezia, hematuria, melena, myalgias, nausea or weight loss. The pain is aggravated by palpation. The pain is relieved by nothing. She has tried nothing for the symptoms. There is no history of abdominal surgery, colon cancer, Crohn's disease, gallstones, GERD, irritable bowel syndrome, pancreatitis, PUD or ulcerative colitis. oral fever blisters  Vaginal Discharge  The patient's primary symptoms include genital itching, a genital odor and vaginal discharge. The patient's pertinent negatives include no genital lesions, genital rash, missed menses, pelvic pain or vaginal bleeding. This is a new problem. The current episode started in the past 7 days. The problem occurs constantly. The problem has been gradually worsening. The pain is severe. Associated symptoms include abdominal pain and vomiting. Pertinent negatives include no anorexia, back pain, chills, constipation, diarrhea,  discolored urine, dysuria, fever, frequency, headaches, hematuria, joint pain, joint swelling, nausea, painful intercourse, rash or urgency. Associated symptoms comments: Vomited one time last night. The vaginal discharge was yellow and malodorous. There has been no bleeding. She has not been passing clots. She has not been passing tissue. Nothing aggravates the symptoms. She has tried antibiotics for the symptoms. The treatment provided no relief. She is sexually active. No, her partner does not have an STD. She uses tubal ligation for contraception. Her past medical history is significant for herpes simplex, ovarian cysts and an STD. There is no history of an abdominal surgery, perineal abscess or PID. (Oral fever blisters)    OB History    Gravida Para Term Preterm AB Living   2 2 1 1  0 1   SAB TAB Ectopic Multiple Live Births   0 0 0 0 1      History reviewed. No pertinent past medical history.  Past Surgical History:  Procedure Laterality Date  . TUBAL LIGATION      History reviewed. No pertinent family history.  Social History  Substance Use Topics  . Smoking status: Former Games developermoker  . Smokeless tobacco: Never Used  . Alcohol use Yes     Comment: occ    Allergies: No Known Allergies  Prescriptions Prior to Admission  Medication Sig Dispense Refill Last Dose  . amoxicillin-clavulanate (AUGMENTIN) 875-125 MG tablet Take 1 tablet by mouth every 12 (twelve) hours. (Patient not taking: Reported on 05/10/2016) 14 tablet 0 Not Taking at Unknown time  . ibuprofen (ADVIL,MOTRIN) 800 MG tablet Take 1 tablet (800 mg total) by mouth 3 (three) times daily. (Patient not taking: Reported on 05/10/2016) 21 tablet 0 Not  Taking at Unknown time    Review of Systems  Constitutional: Negative for chills, fever and weight loss.  Gastrointestinal: Positive for abdominal pain and vomiting. Negative for anorexia, constipation, diarrhea, flatus, hematochezia, melena and nausea.  Genitourinary:  Positive for vaginal discharge. Negative for dysuria, frequency, hematuria, missed menses, pelvic pain and urgency.  Musculoskeletal: Negative for arthralgias, back pain, joint pain and myalgias.  Skin: Negative for rash.  Neurological: Negative for headaches.   Physical Exam   Blood pressure 132/80, pulse 68, temperature 98 F (36.7 C), temperature source Oral, resp. rate 16, height 5' 0.5" (1.537 m), weight 63.5 kg (140 lb), last menstrual period 04/05/2016.  Physical Exam  Constitutional: She appears well-developed and well-nourished.  HENT:  Head: Normocephalic.  Neck: Normal range of motion.  Respiratory: Effort normal. No respiratory distress.  GI: Soft. She exhibits no distension and no mass. There is tenderness. There is no rebound and no guarding.  Genitourinary: Vaginal discharge found.  Genitourinary Comments: External labia normal with no lesions and erythema. Vaginal walls pink with no lesions or erythema; no lesions. Cervix is pink with no lesions, no CMT but suprapubic tenderness on palpation. Moderate amount of odorless white discharge is present in the vagina.   Musculoskeletal: Normal range of motion.  Neurological: She is alert. She has normal reflexes.  Skin: Skin is warm and dry.    MAU Course  Procedures  MDM -Gonorrhea and clamydia cultures -Urine cultures -wet prep -bimanual  Assessment and Plan  Patient Julia Lutz is a 30 year old G2p1101 here with complaints of abdominal pain and discharge for 5 days after she had a retained condom for two days post-coitus. Urine pregnancy test was negative. Wet prep positive for clue cells; gonorrhea and chlamydia cultures sent.  Patient stable for discharge with flagyl prescription and recommendations to follow up with her primary care doctor.   Charlesetta GaribaldiKathryn Lorraine Edgardo Petrenko CNM 05/10/2016, 4:40 PM

## 2016-05-11 LAB — CULTURE, OB URINE: CULTURE: NO GROWTH

## 2016-05-15 LAB — GC/CHLAMYDIA PROBE AMP (~~LOC~~) NOT AT ARMC
Chlamydia: NEGATIVE
NEISSERIA GONORRHEA: NEGATIVE

## 2018-09-12 ENCOUNTER — Encounter (HOSPITAL_BASED_OUTPATIENT_CLINIC_OR_DEPARTMENT_OTHER): Payer: Self-pay | Admitting: *Deleted

## 2018-09-12 ENCOUNTER — Emergency Department (HOSPITAL_BASED_OUTPATIENT_CLINIC_OR_DEPARTMENT_OTHER)
Admission: EM | Admit: 2018-09-12 | Discharge: 2018-09-13 | Disposition: A | Discharge status: Home / Self Care | Payer: PRIVATE HEALTH INSURANCE | Attending: Emergency Medicine | Admitting: Emergency Medicine

## 2018-09-12 ENCOUNTER — Other Ambulatory Visit: Payer: Self-pay

## 2018-09-12 DIAGNOSIS — R112 Nausea with vomiting, unspecified: Secondary | ICD-10-CM | POA: Diagnosis present

## 2018-09-12 DIAGNOSIS — Z87891 Personal history of nicotine dependence: Secondary | ICD-10-CM | POA: Diagnosis not present

## 2018-09-12 DIAGNOSIS — Z79899 Other long term (current) drug therapy: Secondary | ICD-10-CM | POA: Insufficient documentation

## 2018-09-12 LAB — URINALYSIS, ROUTINE W REFLEX MICROSCOPIC
BILIRUBIN URINE: NEGATIVE
GLUCOSE, UA: NEGATIVE mg/dL
HGB URINE DIPSTICK: NEGATIVE
KETONES UR: 15 mg/dL — AB
LEUKOCYTE UA: NEGATIVE
Nitrite: NEGATIVE
PH: 6 (ref 5.0–8.0)
PROTEIN: NEGATIVE mg/dL
Specific Gravity, Urine: <1.005 — ABNORMAL LOW (ref 1.005–1.030)

## 2018-09-12 LAB — PREGNANCY, URINE: Preg Test, Ur: NEGATIVE

## 2018-09-12 MED ORDER — ONDANSETRON HCL 4 MG/2ML IJ SOLN
4.0000 mg | Freq: Once | INTRAMUSCULAR | Status: AC | PRN
  Administered 2018-09-12: 4 mg via INTRAVENOUS
  Filled 2018-09-12: qty 2

## 2018-09-12 NOTE — ED Triage Notes (Addendum)
Pt reports she saw her PCP on Friday for headache and congestion and she was dx with bronchitis and started on doxycycline. States she started vomiting today (x3) and had sweats and is very fatigued

## 2018-09-12 NOTE — ED Provider Notes (Signed)
[facility] Provider Note: Lowella Dell, MD, FACEP  CSN: [id number] MRN: [id number] ARRIVAL: 09/12/18 at 2140 ROOM: [facility]01/[facility]01   CHIEF COMPLAINT  Vomiting   HISTORY OF PRESENT ILLNESS  09/12/18 11:42 PM Julia Lutz is a 33 y.o. female who was seen by her PCP 2 days ago for URI symptoms.  Specifically she has had nasal congestion, sinus headache, cough and chills.  She was diagnosed with bronchitis and placed on doxycycline.  Today she started vomiting and had sweats and felt very fatigued.  She describes the vomiting as bilious and she had 3 episodes.  She was given IV Zofran and a fluid bolus with significant improvement in her symptoms.  She denies diarrhea.  She has some chronic abdominal pain due to IBS which is not severe.  There is no acute change in her abdominal pain.   History reviewed. No pertinent past medical history.  Past Surgical History:  Procedure Laterality Date  . TUBAL LIGATION      No family history on file.  Social History   Tobacco Use  . Smoking status: Former Games developer  . Smokeless tobacco: Never Used  Substance Use Topics  . Alcohol use: Yes    Comment: occ  . Drug use: No    Prior to Admission medications   Medication Sig Start Date End Date Taking? Authorizing Provider  doxycycline (VIBRA-TABS) 100 MG tablet TK 1 T PO BID 09/09/18   [provider]  fluconazole (DIFLUCAN) 150 MG tablet  09/09/18   [provider]  fluticasone (FLONASE) 50 MCG/ACT nasal spray  09/09/18   [provider]  metroNIDAZOLE (FLAGYL) 500 MG tablet TK 1 T TWICE A DAY PO FOR SEVEN DAYS 08/12/18   [provider]    Allergies Patient has no known allergies.   REVIEW OF SYSTEMS  Negative except as noted here or in the History of Present Illness.   PHYSICAL EXAMINATION  Initial Vital Signs Blood pressure (!) 144/97, pulse 76, temperature 98.8 F (37.1 C), temperature source Oral, resp. rate 16, height 5' 0.5" (1.537  m), weight 65.8 kg, last menstrual period 08/25/2018, SpO2 100 %.  Examination General: Well-developed, well-nourished female in no acute distress; appearance consistent with age of record HENT: normocephalic; atraumatic Eyes: pupils equal, round and reactive to light; extraocular muscles intact Neck: supple Heart: regular rate and rhythm Lungs: clear to auscultation bilaterally Abdomen: soft; nondistended; mild diffuse tenderness; no masses or hepatosplenomegaly; bowel sounds present Extremities: No deformity; full range of motion; pulses normal Neurologic: Awake, alert and oriented; motor function intact in all extremities and symmetric; no facial droop Skin: Warm and dry Psychiatric: Normal mood and affect   RESULTS  Summary of this visit's results, reviewed by myself:   EKG Interpretation  Date/Time:    Ventricular Rate:    PR Interval:    QRS Duration:   QT Interval:    QTC Calculation:   R Axis:     Text Interpretation:        Laboratory Studies: Results for orders placed or performed during the hospital encounter of 09/12/18 (from the past 24 hour(s))  Urinalysis, Routine w reflex microscopic     Status: Abnormal   Collection Time: 09/12/18 11:52 PM  Result Value Ref Range   Color, Urine YELLOW YELLOW   APPearance CLEAR CLEAR   Specific Gravity, Urine <1.005 (L) 1.005 - 1.030   pH 6.0 5.0 - 8.0   Glucose, UA NEGATIVE NEGATIVE mg/dL   Hgb urine dipstick  NEGATIVE NEGATIVE   Bilirubin Urine NEGATIVE NEGATIVE   Ketones, ur 15 (A) NEGATIVE mg/dL   Protein, ur NEGATIVE NEGATIVE mg/dL   Nitrite NEGATIVE NEGATIVE   Leukocytes,Ua NEGATIVE NEGATIVE  Pregnancy, urine     Status: None   Collection Time: 09/12/18 11:52 PM  Result Value Ref Range   Preg Test, Ur NEGATIVE NEGATIVE   Imaging Studies: No results found.  ED COURSE and MDM  Nursing notes and initial vitals signs, including pulse oximetry, reviewed.  Vitals:   09/12/18 2144 09/12/18 2300 09/12/18 2330  09/12/18 2351  BP:    127/79  Pulse:  73 76 77  Resp:    16  Temp:    98.6 F (37 C)  TempSrc:      SpO2:  100% 100% 99%  Weight: 65.8 kg     Height: 5' 0.5" (1.537 m)      Julia Lutz was evaluated in Emergency Department on 09/13/2018 for the symptoms described in the history of present illness. She was evaluated in the context of the global COVID-19 pandemic, which necessitated consideration that the patient might be at risk for infection with the SARS-CoV-2 virus that causes COVID-19. Institutional protocols and algorithms that pertain to the evaluation of patients at risk for COVID-19 are in a state of rapid change based on information released by regulatory bodies including the CDC and federal and state organizations. These policies and algorithms were followed during the patient's care in the ED.   PROCEDURES    ED DIAGNOSES     ICD-10-CM   1. Nausea and vomiting in adult R11.2        Nashae Maudlin, Jonny Ruiz, MD 09/13/18 [phone]

## 2018-09-12 NOTE — ED Notes (Signed)
ED Provider at bedside. 

## 2018-09-13 MED ORDER — ONDANSETRON 8 MG PO TBDP: 8.0000 mg | ORAL_TABLET | Freq: Three times a day (TID) | ORAL | 0 refills | Status: AC | PRN

## 2020-02-09 ENCOUNTER — Other Ambulatory Visit: Payer: Self-pay

## 2020-02-09 ENCOUNTER — Other Ambulatory Visit: Payer: PRIVATE HEALTH INSURANCE

## 2020-02-09 DIAGNOSIS — Z20822 Contact with and (suspected) exposure to covid-19: Secondary | ICD-10-CM

## 2020-02-11 LAB — SARS-COV-2, NAA 2 DAY TAT

## 2020-02-11 LAB — NOVEL CORONAVIRUS, NAA: SARS-CoV-2, NAA: NOT DETECTED
